# Patient Record
Sex: Male | Born: 1980 | Race: Black or African American | Hispanic: No | Marital: Single | Smoking: Current every day smoker
Health system: Southern US, Community
[De-identification: ages and names within clinical notes are randomized; demographics above are authoritative.]

## PROBLEM LIST (undated history)

## (undated) HISTORY — PX: FINGER SURGERY: SHX640

---

## 2003-11-11 ENCOUNTER — Emergency Department (HOSPITAL_COMMUNITY): Admission: EM | Admit: 2003-11-11 | Discharge: 2003-11-11 | Payer: Self-pay | Admitting: Podiatry

## 2004-04-30 ENCOUNTER — Observation Stay (HOSPITAL_COMMUNITY): Admission: EM | Admit: 2004-04-30 | Discharge: 2004-05-01 | Payer: Self-pay | Admitting: Emergency Medicine

## 2004-06-15 ENCOUNTER — Ambulatory Visit (HOSPITAL_BASED_OUTPATIENT_CLINIC_OR_DEPARTMENT_OTHER): Admission: RE | Admit: 2004-06-15 | Discharge: 2004-06-15 | Payer: Self-pay | Admitting: Orthopedic Surgery

## 2004-11-02 ENCOUNTER — Ambulatory Visit (HOSPITAL_BASED_OUTPATIENT_CLINIC_OR_DEPARTMENT_OTHER): Admission: RE | Admit: 2004-11-02 | Discharge: 2004-11-02 | Payer: Self-pay | Admitting: Orthopedic Surgery

## 2004-12-07 ENCOUNTER — Emergency Department (HOSPITAL_COMMUNITY): Admission: EM | Admit: 2004-12-07 | Discharge: 2004-12-07 | Payer: Self-pay | Admitting: Emergency Medicine

## 2012-10-31 ENCOUNTER — Encounter (HOSPITAL_COMMUNITY): Payer: Self-pay

## 2012-10-31 ENCOUNTER — Emergency Department (HOSPITAL_COMMUNITY)
Admission: EM | Admit: 2012-10-31 | Discharge: 2012-10-31 | Disposition: A | Discharge status: Home / Self Care | Payer: Managed Care, Other (non HMO) | Attending: Emergency Medicine | Admitting: Emergency Medicine

## 2012-10-31 DIAGNOSIS — F17200 Nicotine dependence, unspecified, uncomplicated: Secondary | ICD-10-CM | POA: Insufficient documentation

## 2012-10-31 DIAGNOSIS — B349 Viral infection, unspecified: Secondary | ICD-10-CM

## 2012-10-31 DIAGNOSIS — B9789 Other viral agents as the cause of diseases classified elsewhere: Secondary | ICD-10-CM | POA: Insufficient documentation

## 2012-10-31 DIAGNOSIS — R5381 Other malaise: Secondary | ICD-10-CM | POA: Insufficient documentation

## 2012-10-31 LAB — CBC WITH DIFFERENTIAL/PLATELET
HCT: 46.7 % (ref 39.0–52.0)
Hemoglobin: 16.7 g/dL (ref 13.0–17.0)
Lymphocytes Relative: 20 % (ref 12–46)
Lymphs Abs: 0.8 10*3/uL (ref 0.7–4.0)
MCH: 29.4 pg (ref 26.0–34.0)
MCV: 82.2 fL (ref 78.0–100.0)
Monocytes Absolute: 0.8 10*3/uL (ref 0.1–1.0)
Monocytes Relative: 21 % — ABNORMAL HIGH (ref 3–12)
Neutrophils Relative %: 58 % (ref 43–77)
Platelets: 176 10*3/uL (ref 150–400)
RBC: 5.68 MIL/uL (ref 4.22–5.81)
WBC: 3.8 10*3/uL — ABNORMAL LOW (ref 4.0–10.5)

## 2012-10-31 LAB — COMPREHENSIVE METABOLIC PANEL
ALT: 27 U/L (ref 0–53)
Albumin: 4.2 g/dL (ref 3.5–5.2)
BUN: 15 mg/dL (ref 6–23)
Calcium: 9.7 mg/dL (ref 8.4–10.5)
Sodium: 134 mEq/L — ABNORMAL LOW (ref 135–145)
Total Protein: 8 g/dL (ref 6.0–8.3)

## 2012-10-31 MED ORDER — SODIUM CHLORIDE 0.9 % IV BOLUS (SEPSIS)
1000.0000 mL | Freq: Once | INTRAVENOUS | Status: AC
  Administered 2012-10-31: 1000 mL via INTRAVENOUS

## 2012-10-31 MED ORDER — ONDANSETRON HCL 4 MG/2ML IJ SOLN
4.0000 mg | Freq: Once | INTRAMUSCULAR | Status: AC
  Administered 2012-10-31: 4 mg via INTRAVENOUS
  Filled 2012-10-31: qty 2

## 2012-10-31 MED ORDER — ACETAMINOPHEN 500 MG PO TABS: 500.0000 mg | ORAL_TABLET | Freq: Four times a day (QID) | ORAL | Status: DC | PRN

## 2012-10-31 MED ORDER — ACETAMINOPHEN 500 MG PO TABS
1000.0000 mg | ORAL_TABLET | Freq: Once | ORAL | Status: AC
Start: 2012-10-31 — End: 2012-10-31
  Administered 2012-10-31: 1000 mg via ORAL
  Filled 2012-10-31: qty 2

## 2012-10-31 MED ORDER — PROMETHAZINE HCL 25 MG PO TABS: 25.0000 mg | ORAL_TABLET | Freq: Four times a day (QID) | ORAL | Status: DC | PRN

## 2012-10-31 NOTE — ED Provider Notes (Signed)
History     CSN: [id number]  Arrival date & time 10/31/12  4098   First MD Initiated Contact with Patient 10/31/12 [phone]      Chief Complaint  Patient presents with  . Emesis    (Consider location/radiation/quality/duration/timing/severity/associated sxs/prior treatment) HPI Comments: Patient is a 32 year old male who presents with a 2 day history of nausea and vomiting. Symptoms started gradually and progressively worsened since the onset. Yesterday patient reports 4 episodes of vomiting. He did not try anything for symptoms. Today he still feels nauseous and fatigued but has not vomited. Patient reports associated moderate headache. No aggravating/alleviating factors.    History reviewed. No pertinent past medical history.  Past Surgical History  Procedure Date  . Finger surgery     2nd and 3rd digits    No family history on file.  History  Substance Use Topics  . Smoking status: Current Every Day Smoker    Types: Cigars  . Smokeless tobacco: Not on file  . Alcohol Use: No      Review of Systems  Constitutional: Positive for fatigue.  Gastrointestinal: Positive for nausea and vomiting.  All other systems reviewed and are negative.    Allergies  Review of patient's allergies indicates no known allergies.  Home Medications   Current Outpatient Rx  Name  Route  Sig  Dispense  Refill  . ACETAMINOPHEN 500 MG PO TABS   Oral   Take 1,000 mg by mouth every 6 (six) hours as needed. For pain           BP 130/86  Pulse 63  Temp 98.4 F (36.9 C) (Oral)  Resp 20  SpO2 98%  Physical Exam  Nursing note and vitals reviewed. Constitutional: He is oriented to person, place, and time. He appears well-developed and well-nourished. No distress.  HENT:  Head: Normocephalic and atraumatic.  Mouth/Throat: Oropharynx is clear and moist. No oropharyngeal exudate.  Eyes: Conjunctivae normal and EOM are normal. Pupils are equal, round, and reactive to light.  Neck:  Normal range of motion. Neck supple.  Cardiovascular: Normal rate and regular rhythm.  Exam reveals no gallop and no friction rub.   No murmur heard. Pulmonary/Chest: Effort normal and breath sounds normal. He has no wheezes. He has no rales. He exhibits no tenderness.  Abdominal: Soft. He exhibits no distension. There is no tenderness. There is no rebound and no guarding.  Musculoskeletal: Normal range of motion.  Neurological: He is alert and oriented to person, place, and time. Coordination normal.       Speech is goal-oriented. Moves limbs without ataxia.   Skin: Skin is warm and dry. He is not diaphoretic.  Psychiatric: He has a normal mood and affect. His behavior is normal.    ED Course  Procedures (including critical care time)  Labs Reviewed  CBC WITH DIFFERENTIAL - Abnormal; Notable for the following:    WBC 3.8 (*)     Monocytes Relative 21 (*)     All other components within normal limits  COMPREHENSIVE METABOLIC PANEL - Abnormal; Notable for the following:    Sodium 134 (*)     Glucose, Bld 101 (*)     AST 75 (*)     All other components within normal limits  URINALYSIS, MICROSCOPIC ONLY   No results found.   1. Viral illness       MDM  8:45 AM Basic labs, urinalysis pending. Patient will have fluids and zofran. Vitals stable.   10:01 AM  Labs unremarkable. Patient feels better after fluids and zofran. I will give him tylenol for headache. Patient will be discharged with tylenol and zofran. Patient non toxic appearing and afebrile with stable vitals. Patient instructed to return with worsening or concerning symptoms. No further evaluation needed at this time.       Emilia Beck, PA-C 10/31/12 1009

## 2012-10-31 NOTE — ED Notes (Signed)
Pt presents for vomiting since Sunday. No diarrhea and no emesis today but feels fatigued.

## 2012-11-01 NOTE — ED Provider Notes (Signed)
Medical screening examination/treatment/procedure(s) were performed by non-physician practitioner and as supervising physician I was immediately available for consultation/collaboration.  Luchiano Viscomi K Mesiah Manzo, MD 11/01/12 0243 

## 2014-03-06 ENCOUNTER — Encounter (HOSPITAL_COMMUNITY): Payer: Self-pay | Admitting: Emergency Medicine

## 2014-03-06 ENCOUNTER — Emergency Department (HOSPITAL_COMMUNITY)
Admission: EM | Admit: 2014-03-06 | Discharge: 2014-03-07 | Disposition: A | Discharge status: Home / Self Care | Payer: BC Managed Care – PPO | Attending: Emergency Medicine | Admitting: Emergency Medicine

## 2014-03-06 DIAGNOSIS — S0190XA Unspecified open wound of unspecified part of head, initial encounter: Secondary | ICD-10-CM | POA: Insufficient documentation

## 2014-03-06 DIAGNOSIS — W268XXA Contact with other sharp object(s), not elsewhere classified, initial encounter: Secondary | ICD-10-CM | POA: Insufficient documentation

## 2014-03-06 DIAGNOSIS — S0191XA Laceration without foreign body of unspecified part of head, initial encounter: Secondary | ICD-10-CM

## 2014-03-06 DIAGNOSIS — Y9389 Activity, other specified: Secondary | ICD-10-CM | POA: Insufficient documentation

## 2014-03-06 DIAGNOSIS — F17200 Nicotine dependence, unspecified, uncomplicated: Secondary | ICD-10-CM | POA: Insufficient documentation

## 2014-03-06 DIAGNOSIS — Y929 Unspecified place or not applicable: Secondary | ICD-10-CM | POA: Insufficient documentation

## 2014-03-06 DIAGNOSIS — S61409A Unspecified open wound of unspecified hand, initial encounter: Secondary | ICD-10-CM | POA: Insufficient documentation

## 2014-03-06 DIAGNOSIS — S61419A Laceration without foreign body of unspecified hand, initial encounter: Secondary | ICD-10-CM

## 2014-03-06 MED ORDER — OXYCODONE-ACETAMINOPHEN 5-325 MG PO TABS
1.0000 | ORAL_TABLET | Freq: Once | ORAL | Status: AC
  Administered 2014-03-06: 1 via ORAL
  Filled 2014-03-06: qty 1
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
## 2014-03-07 NOTE — ED Provider Notes (Signed)
Medical screening examination/treatment/procedure(s) were performed by non-physician practitioner and as supervising physician I was immediately available for consultation/collaboration.   EKG Interpretation None       Glynn Octave, MD 03/07/14 1059

## 2014-03-07 NOTE — Discharge Instructions (Signed)
Keep sutures clean and dry. Follow up with urgent care in 1 week for suture removal. Return to the ED for new concerns.

## 2014-03-24 ENCOUNTER — Ambulatory Visit (INDEPENDENT_AMBULATORY_CARE_PROVIDER_SITE_OTHER): Payer: BC Managed Care – PPO | Admitting: Family Medicine

## 2014-03-24 VITALS — BP 122/78 | HR 70 | Temp 97.9°F | Resp 15 | Ht 71.0 in | Wt 149.0 lb

## 2014-03-24 DIAGNOSIS — IMO0002 Reserved for concepts with insufficient information to code with codable children: Secondary | ICD-10-CM

## 2014-03-24 DIAGNOSIS — Z4802 Encounter for removal of sutures: Secondary | ICD-10-CM

## 2014-06-05 NOTE — Progress Notes (Signed)
   Subjective:    Patient ID: Kevin Dominguez, male    DOB: 10/31/1980, 33 y.o.   MRN: [id number] Chief Complaint  Patient presents with  . Suture / Staple Removal    patient was to have them removed at Urgent care on 68 but was unable to make it    HPI  History reviewed. No pertinent past medical history. Current Outpatient Prescriptions on File Prior to Visit  Medication Sig Dispense Refill  . acetaminophen (TYLENOL) 500 MG tablet Take 1,000 mg by mouth every 6 (six) hours as needed. For pain       No current facility-administered medications on file prior to visit.   No Known Allergies   Review of Systems  Constitutional: Negative for fever, chills, activity change and appetite change.  Cardiovascular: Negative for leg swelling.  Gastrointestinal: Negative for nausea, vomiting, abdominal pain, diarrhea and constipation.  Musculoskeletal: Negative for gait problem, joint swelling and myalgias.  Skin: Positive for wound. Negative for rash.  Neurological: Negative for weakness and numbness.  Hematological: Negative for adenopathy. Does not bruise/bleed easily.       Objective:  BP 122/78  Pulse 70  Temp(Src) 97.9 F (36.6 C) (Oral)  Resp 15  Ht 5\' 11"  (1.803 m)  Wt 149 lb (67.586 kg)  BMI 20.79 kg/m2  SpO2 96%  Physical Exam  Constitutional: He is oriented to person, place, and time. He appears well-developed and well-nourished. No distress.  HENT:  Head: Normocephalic and atraumatic.  Eyes: No scleral icterus.  Pulmonary/Chest: Effort normal.  Neurological: He is alert and oriented to person, place, and time.  Skin: Skin is warm and dry. He is not diaphoretic.  Psychiatric: He has a normal mood and affect. His behavior is normal.          Assessment & Plan:  Visit for suture removal  Laceration    Norberto SorensonEva Shaw, MD MPH

## 2017-08-28 ENCOUNTER — Emergency Department (HOSPITAL_COMMUNITY)
Admission: EM | Admit: 2017-08-28 | Discharge: 2017-08-28 | Disposition: A | Discharge status: Home / Self Care | Payer: BLUE CROSS/BLUE SHIELD | Attending: Emergency Medicine | Admitting: Emergency Medicine

## 2017-08-28 ENCOUNTER — Other Ambulatory Visit: Payer: Self-pay

## 2017-08-28 ENCOUNTER — Emergency Department (HOSPITAL_COMMUNITY): Payer: BLUE CROSS/BLUE SHIELD

## 2017-08-28 ENCOUNTER — Encounter (HOSPITAL_COMMUNITY): Payer: Self-pay

## 2017-08-28 DIAGNOSIS — S6991XA Unspecified injury of right wrist, hand and finger(s), initial encounter: Secondary | ICD-10-CM | POA: Diagnosis present

## 2017-08-28 DIAGNOSIS — F17290 Nicotine dependence, other tobacco product, uncomplicated: Secondary | ICD-10-CM | POA: Diagnosis not present

## 2017-08-28 DIAGNOSIS — W2209XA Striking against other stationary object, initial encounter: Secondary | ICD-10-CM | POA: Diagnosis not present

## 2017-08-28 DIAGNOSIS — S62356A Nondisplaced fracture of shaft of fifth metacarpal bone, right hand, initial encounter for closed fracture: Secondary | ICD-10-CM | POA: Insufficient documentation

## 2017-08-28 DIAGNOSIS — Y998 Other external cause status: Secondary | ICD-10-CM | POA: Diagnosis not present

## 2017-08-28 DIAGNOSIS — Y939 Activity, unspecified: Secondary | ICD-10-CM | POA: Diagnosis not present

## 2017-08-28 DIAGNOSIS — Y929 Unspecified place or not applicable: Secondary | ICD-10-CM | POA: Insufficient documentation

## 2017-08-28 MED ORDER — NAPROXEN 500 MG PO TABS: 500.0000 mg | ORAL_TABLET | Freq: Two times a day (BID) | ORAL | 0 refills | Status: DC

## 2017-08-28 NOTE — Discharge Instructions (Signed)
Elevate arm to help reduce swelling.  Naproxen - take twice daily. Take with food. Please follow up with Dr. Mina MarbleWeingold (Hand doctor)

## 2017-08-28 NOTE — ED Triage Notes (Signed)
Per Pt, pt is coming from home with complaints of right hand swelling and pain secondary to hitting a wall yesterday.
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
## 2017-08-28 NOTE — Progress Notes (Signed)
Orthopedic Tech Progress Note Patient Details:  Kevin Dominguez 09/26/1981 [id number]  Ortho Devices Type of Ortho Device: Ulna gutter splint Ortho Device/Splint Location: Right  Ortho Device/Splint Interventions: Application   Alvina ChouWilliams, Malvina Schadler C 08/28/2017, 7:04 PM

## 2018-01-06 ENCOUNTER — Emergency Department (HOSPITAL_COMMUNITY)
Admission: EM | Admit: 2018-01-06 | Discharge: 2018-01-06 | Disposition: A | Discharge status: Home / Self Care | Payer: BLUE CROSS/BLUE SHIELD | Attending: Emergency Medicine | Admitting: Emergency Medicine

## 2018-01-06 ENCOUNTER — Encounter (HOSPITAL_COMMUNITY): Payer: Self-pay

## 2018-01-06 DIAGNOSIS — F17290 Nicotine dependence, other tobacco product, uncomplicated: Secondary | ICD-10-CM | POA: Diagnosis not present

## 2018-01-06 DIAGNOSIS — Z79899 Other long term (current) drug therapy: Secondary | ICD-10-CM | POA: Insufficient documentation

## 2018-01-06 DIAGNOSIS — K0889 Other specified disorders of teeth and supporting structures: Secondary | ICD-10-CM | POA: Diagnosis present

## 2018-01-06 MED ORDER — PENICILLIN V POTASSIUM 500 MG PO TABS
500.0000 mg | ORAL_TABLET | Freq: Once | ORAL | Status: AC
  Administered 2018-01-06: 500 mg via ORAL
  Filled 2018-01-06: qty 1

## 2018-01-06 MED ORDER — IBUPROFEN 800 MG PO TABS: 800.0000 mg | ORAL_TABLET | Freq: Three times a day (TID) | ORAL | 0 refills | Status: DC

## 2018-01-06 MED ORDER — PENICILLIN V POTASSIUM 500 MG PO TABS: 500.0000 mg | ORAL_TABLET | Freq: Four times a day (QID) | ORAL | 0 refills | Status: DC

## 2018-01-06 MED ORDER — IBUPROFEN 800 MG PO TABS
800.0000 mg | ORAL_TABLET | Freq: Once | ORAL | Status: AC
Start: 2018-01-06 — End: 2018-01-06
  Administered 2018-01-06: 800 mg via ORAL
  Filled 2018-01-06: qty 1

## 2018-01-06 NOTE — ED Notes (Signed)
Bed: WTR6 Expected date:  Expected time:  Means of arrival:  Comments: 

## 2018-01-06 NOTE — ED Triage Notes (Signed)
Pt complains of upper dental pain since last night

## 2018-01-06 NOTE — ED Provider Notes (Signed)
[facility]-EMERGENCY DEPT Provider Note   CSN: [id number] Arrival date & time: 01/06/18  0441     History   Chief Complaint Chief Complaint  Patient presents with  . Dental Pain    HPI Kevin Dominguez is a 37 y.o. male.  Patient presents to the emergency department with chief complaint of dental pain.  He states pain started yesterday.  He is uncertain whether he broke a tooth or not.  He states that he has tried taking over-the-counter pain medicine with no relief.  He reports subjective fever.  He does not have a dentist.  The pain does not radiate.  He rates pain is moderate.  The history is provided by the patient. No language interpreter was used.    History reviewed. No pertinent past medical history.  There are no active problems to display for this patient.   Past Surgical History:  Procedure Laterality Date  . FINGER SURGERY     2nd and 3rd digits        Home Medications    Prior to Admission medications   Medication Sig Start Date End Date Taking? Authorizing Provider  acetaminophen (TYLENOL) 500 MG tablet Take 1,000 mg by mouth every 6 (six) hours as needed. For pain    [provider]  naproxen (NAPROSYN) 500 MG tablet Take 1 tablet (500 mg total) 2 (two) times daily by mouth. 08/28/17   Bethel Born, PA-C    Family History History reviewed. No pertinent family history.  Social History Social History   Tobacco Use  . Smoking status: Current Every Day Smoker    Types: Cigars  . Smokeless tobacco: Never Used  Substance Use Topics  . Alcohol use: Yes  . Drug use: Yes    Types: Marijuana     Allergies   Patient has no known allergies.   Review of Systems Review of Systems  All other systems reviewed and are negative.    Physical Exam Updated Vital Signs BP (!) 149/111 (BP Location: Left Arm)   Pulse (!) 59   Temp 99.6 F (37.6 C) (Oral)   Resp 16   SpO2 99%   Physical Exam Physical  Exam  Constitutional: Pt appears well-developed and well-nourished.  HENT:  Head: Normocephalic.  Right Ear: Tympanic membrane, external ear and ear canal normal.  Left Ear: Tympanic membrane, external ear and ear canal normal.  Nose: Nose normal. Right sinus exhibits no maxillary sinus tenderness and no frontal sinus tenderness. Left sinus exhibits no maxillary sinus tenderness and no frontal sinus tenderness.  Mouth/Throat: Uvula is midline, oropharynx is clear and moist and mucous membranes are normal. No oral lesions. No uvula swelling or lacerations. No oropharyngeal exudate, posterior oropharyngeal edema, posterior oropharyngeal erythema or tonsillar abscesses.  Poor dentition No gingival swelling, fluctuance or induration No gross abscess  No sublingual edema, tenderness to palpation, or sign of Ludwig's angina, or deep space infection Pain at right upper premolar Eyes: Conjunctivae are normal. Pupils are equal, round, and reactive to light. Right eye exhibits no discharge. Left eye exhibits no discharge.  Neck: Normal range of motion. Neck supple.  No stridor Handling secretions without difficulty No nuchal rigidity No cervical lymphadenopathy Cardiovascular: Normal rate, regular rhythm and normal heart sounds.   Pulmonary/Chest: Effort normal. No respiratory distress.  Equal chest rise  Abdominal: Soft. Bowel sounds are normal. Pt exhibits no distension. There is no tenderness.  Lymphadenopathy: Pt has no cervical adenopathy.  Neurological: Pt is alert and  oriented x 4  Skin: Skin is warm and dry.  Psychiatric: Pt has a normal mood and affect.  Nursing note and vitals reviewed.    ED Treatments / Results  Labs (all labs ordered are listed, but only abnormal results are displayed) Labs Reviewed - No data to display  EKG None  Radiology No results found.  Procedures Procedures (including critical care time)  Medications Ordered in ED Medications  penicillin v  potassium (VEETID) tablet 500 mg (has no administration in time range)  ibuprofen (ADVIL,MOTRIN) tablet 800 mg (has no administration in time range)     Initial Impression / Assessment and Plan / ED Course  I have reviewed the triage vital signs and the nursing notes.  Pertinent labs & imaging results that were available during my care of the patient were reviewed by me and considered in my medical decision making (see chart for details).     Patient with dentalgia.  No abscess requiring immediate incision and drainage.  Exam not concerning for Ludwig's angina or pharyngeal abscess.  Will treat with penicillin. Pt instructed to follow-up with dentist.  Discussed return precautions. Pt safe for discharge.  Final Clinical Impressions(s) / ED Diagnoses   Final diagnoses:  Pain, dental    ED Discharge Orders        Ordered    penicillin v potassium (VEETID) 500 MG tablet  4 times daily     01/06/18 0557    ibuprofen (ADVIL,MOTRIN) 800 MG tablet  3 times daily     01/06/18 0557       Roxy HorsemanBrowning, Shantoria Ellwood, PA-C 01/06/18 0557    Palumbo, April, MD 01/06/18 [phone]

## 2018-03-26 ENCOUNTER — Other Ambulatory Visit: Payer: Self-pay

## 2018-03-26 ENCOUNTER — Emergency Department (HOSPITAL_COMMUNITY)
Admission: EM | Admit: 2018-03-26 | Discharge: 2018-03-26 | Disposition: A | Discharge status: Home / Self Care | Payer: BLUE CROSS/BLUE SHIELD | Attending: Emergency Medicine | Admitting: Emergency Medicine

## 2018-03-26 ENCOUNTER — Encounter (HOSPITAL_COMMUNITY): Payer: Self-pay | Admitting: Emergency Medicine

## 2018-03-26 DIAGNOSIS — M25521 Pain in right elbow: Secondary | ICD-10-CM | POA: Diagnosis present

## 2018-03-26 DIAGNOSIS — Z79899 Other long term (current) drug therapy: Secondary | ICD-10-CM | POA: Insufficient documentation

## 2018-03-26 DIAGNOSIS — F17290 Nicotine dependence, other tobacco product, uncomplicated: Secondary | ICD-10-CM | POA: Diagnosis not present

## 2018-03-26 DIAGNOSIS — M7701 Medial epicondylitis, right elbow: Secondary | ICD-10-CM | POA: Insufficient documentation

## 2018-03-26 MED ORDER — PREDNISONE 10 MG PO TABS: ORAL_TABLET | ORAL | 0 refills | Status: DC

## 2018-03-26 NOTE — ED Triage Notes (Signed)
Pt c/o right elbow pain, was taking Motrin 800mg  for pain, now motrin not helping. No known injury.

## 2018-03-26 NOTE — ED Provider Notes (Signed)
[facility] Provider [facility]   CSN: [id number] Arrival date & time: 03/26/18  1144     History   Chief Complaint Chief Complaint  Patient presents with  . Elbow Pain    HPI Kevin Dominguez is a 37 y.o. male.  Pt comes in with c/o right elbow pain that has been going on for about a month. He states that he switched jobs and he does a lot of repetitive motion. Denies numbness or weakness. Has taken ibuprofen with short term relief     History reviewed. No pertinent past medical history.  There are no active problems to display for this patient.   Past Surgical History:  Procedure Laterality Date  . FINGER SURGERY     2nd and 3rd digits        Home Medications    Prior to Admission medications   Medication Sig Start Date End Date Taking? Authorizing Provider  acetaminophen (TYLENOL) 500 MG tablet Take 1,000 mg by mouth every 6 (six) hours as needed. For pain    [provider]  ibuprofen (ADVIL,MOTRIN) 800 MG tablet Take 1 tablet (800 mg total) by mouth 3 (three) times daily. 01/06/18   Roxy HorsemanBrowning, Robert, PA-C  naproxen (NAPROSYN) 500 MG tablet Take 1 tablet (500 mg total) 2 (two) times daily by mouth. 08/28/17   Bethel BornGekas, Kelly Marie, PA-C  penicillin v potassium (VEETID) 500 MG tablet Take 1 tablet (500 mg total) by mouth 4 (four) times daily. 01/06/18   Roxy HorsemanBrowning, Robert, PA-C  predniSONE (DELTASONE) 10 MG tablet 6 day step down dose 03/26/18   Teressa LowerPickering, Sanjit Mcmichael, NP    Family History No family history on file.  Social History Social History   Tobacco Use  . Smoking status: Current Every Day Smoker    Types: Cigars  . Smokeless tobacco: Never Used  Substance Use Topics  . Alcohol use: Yes  . Drug use: Yes    Types: Marijuana     Allergies   Patient has no known allergies.   Review of Systems Review of Systems  All other systems reviewed and are negative.    Physical Exam Updated Vital Signs BP  (!) 148/108 (BP Location: Left Arm)   Pulse (!) 52   Temp 98.2 F (36.8 C) (Oral)   Resp 18   Ht 6' (1.829 m)   Wt 68 kg (150 lb)   SpO2 100%   BMI 20.34 kg/m   Physical Exam  Constitutional: He is oriented to person, place, and time. He appears well-developed and well-nourished.  Cardiovascular: Normal rate.  Pulmonary/Chest: Effort normal.  Musculoskeletal: Normal range of motion.  Full rom of the right elbow. No swelling or deformity noted. Tender to the medial elbow  Neurological: He is alert and oriented to person, place, and time.  Skin: Skin is warm and dry.  Nursing note and vitals reviewed.    ED Treatments / Results  Labs (all labs ordered are listed, but only abnormal results are displayed) Labs Reviewed - No data to display  EKG None  Radiology No results found.  Procedures Procedures (including critical care time)  Medications Ordered in ED Medications - No data to display   Initial Impression / Assessment and Plan / ED Course  I have reviewed the triage vital signs and the nursing notes.  Pertinent labs & imaging results that were available during my care of the patient were reviewed by me and considered in my medical decision making (see chart for  details).     Discussed supportive care and follow up with pt  Final Clinical Impressions(s) / ED Diagnoses   Final diagnoses:  Medial epicondylitis of right elbow    ED Discharge Orders        Ordered    predniSONE (DELTASONE) 10 MG tablet     03/26/18 1343       Teressa Lower, NP 03/26/18 1346    Donnetta Hutching, MD 03/28/18 1040

## 2018-03-26 NOTE — ED Notes (Signed)
Pt verbalized understanding discharge instructions and denies any further needs or questions at this time. VS stable, ambulatory and steady gait.   See EDP assessment / note.   

## 2018-03-26 NOTE — Discharge Instructions (Signed)
Get a compression band from the pharmacy

## 2018-06-08 IMAGING — DX DG HAND COMPLETE 3+V*R*
3 series · 3 of 3 positions shown · non-contrast
Comparison: None.

CLINICAL DATA: Patient reports he punched a wall, patient had
trouble straightening his right hand, patient.

EXAM:
RIGHT HAND - COMPLETE 3+ VIEW

[hand ap]
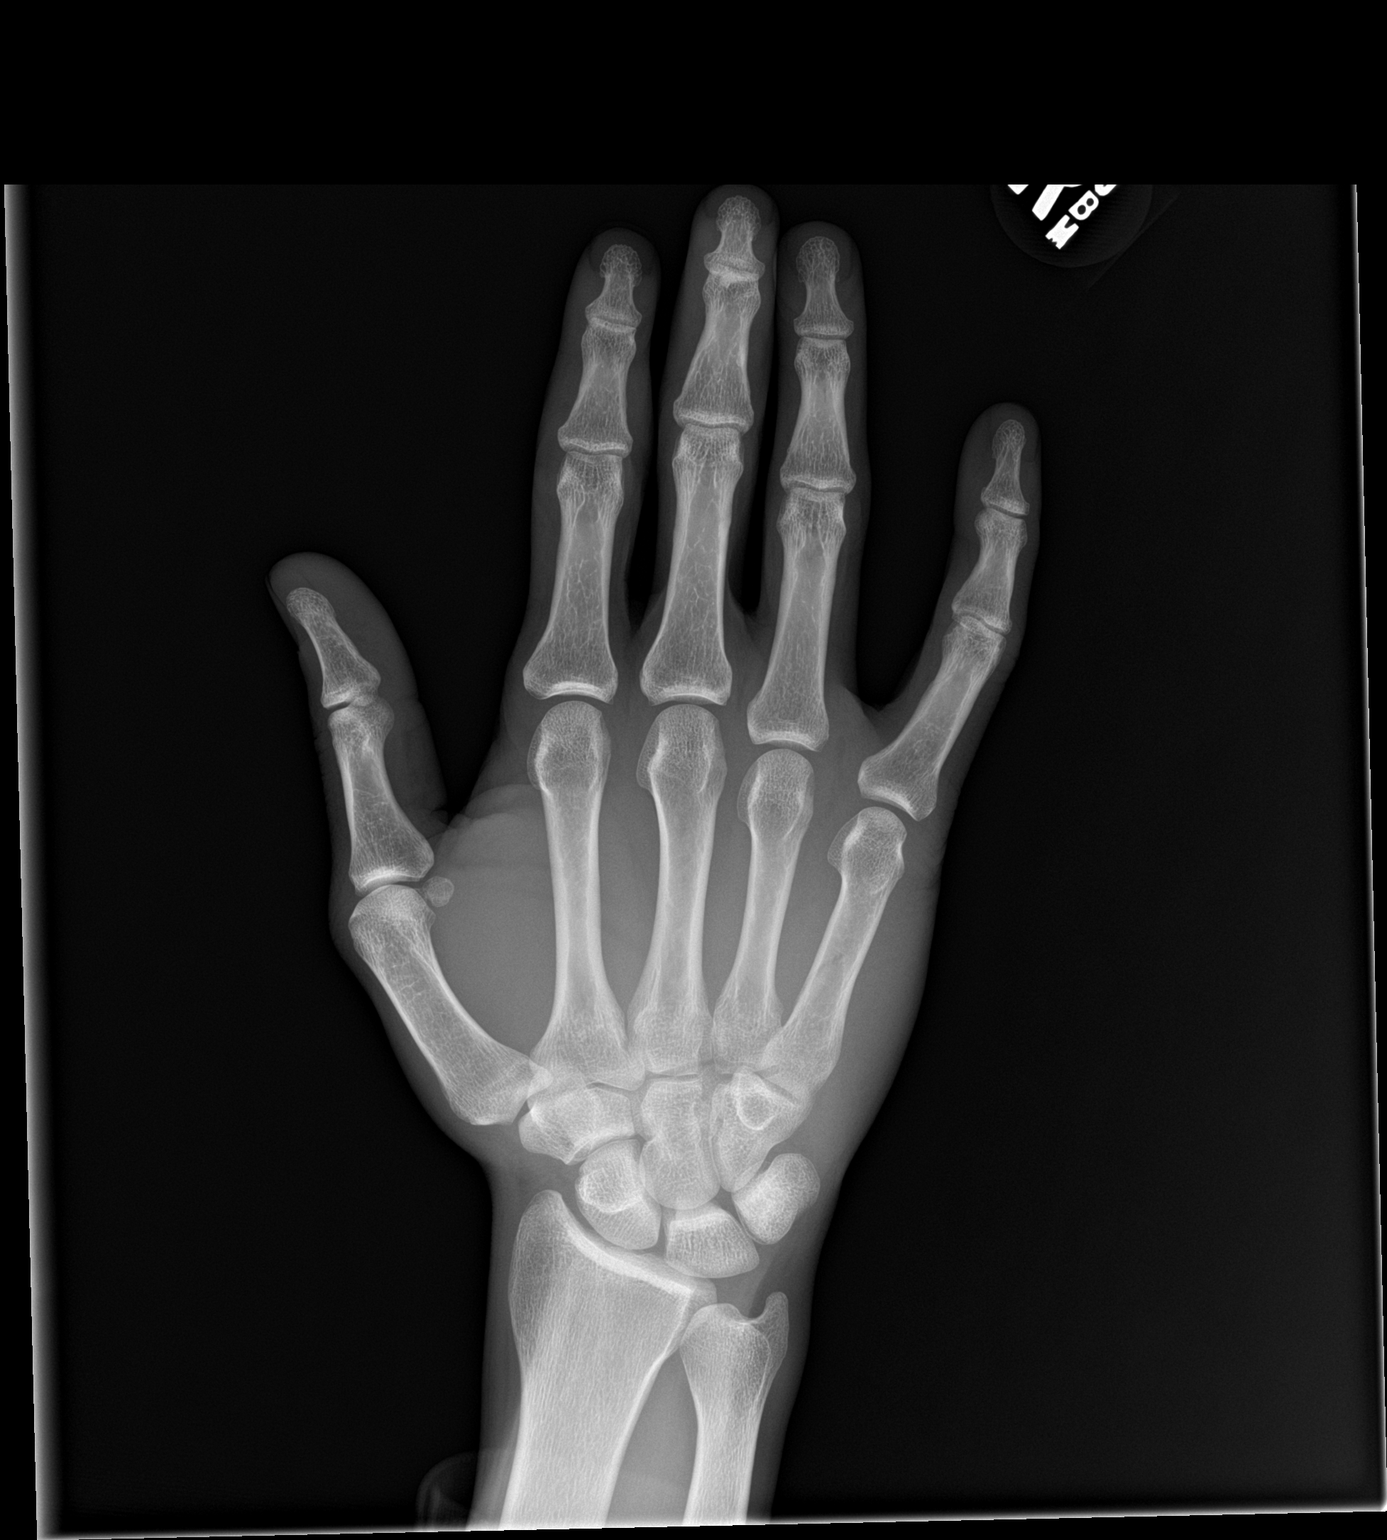

[hand obl]
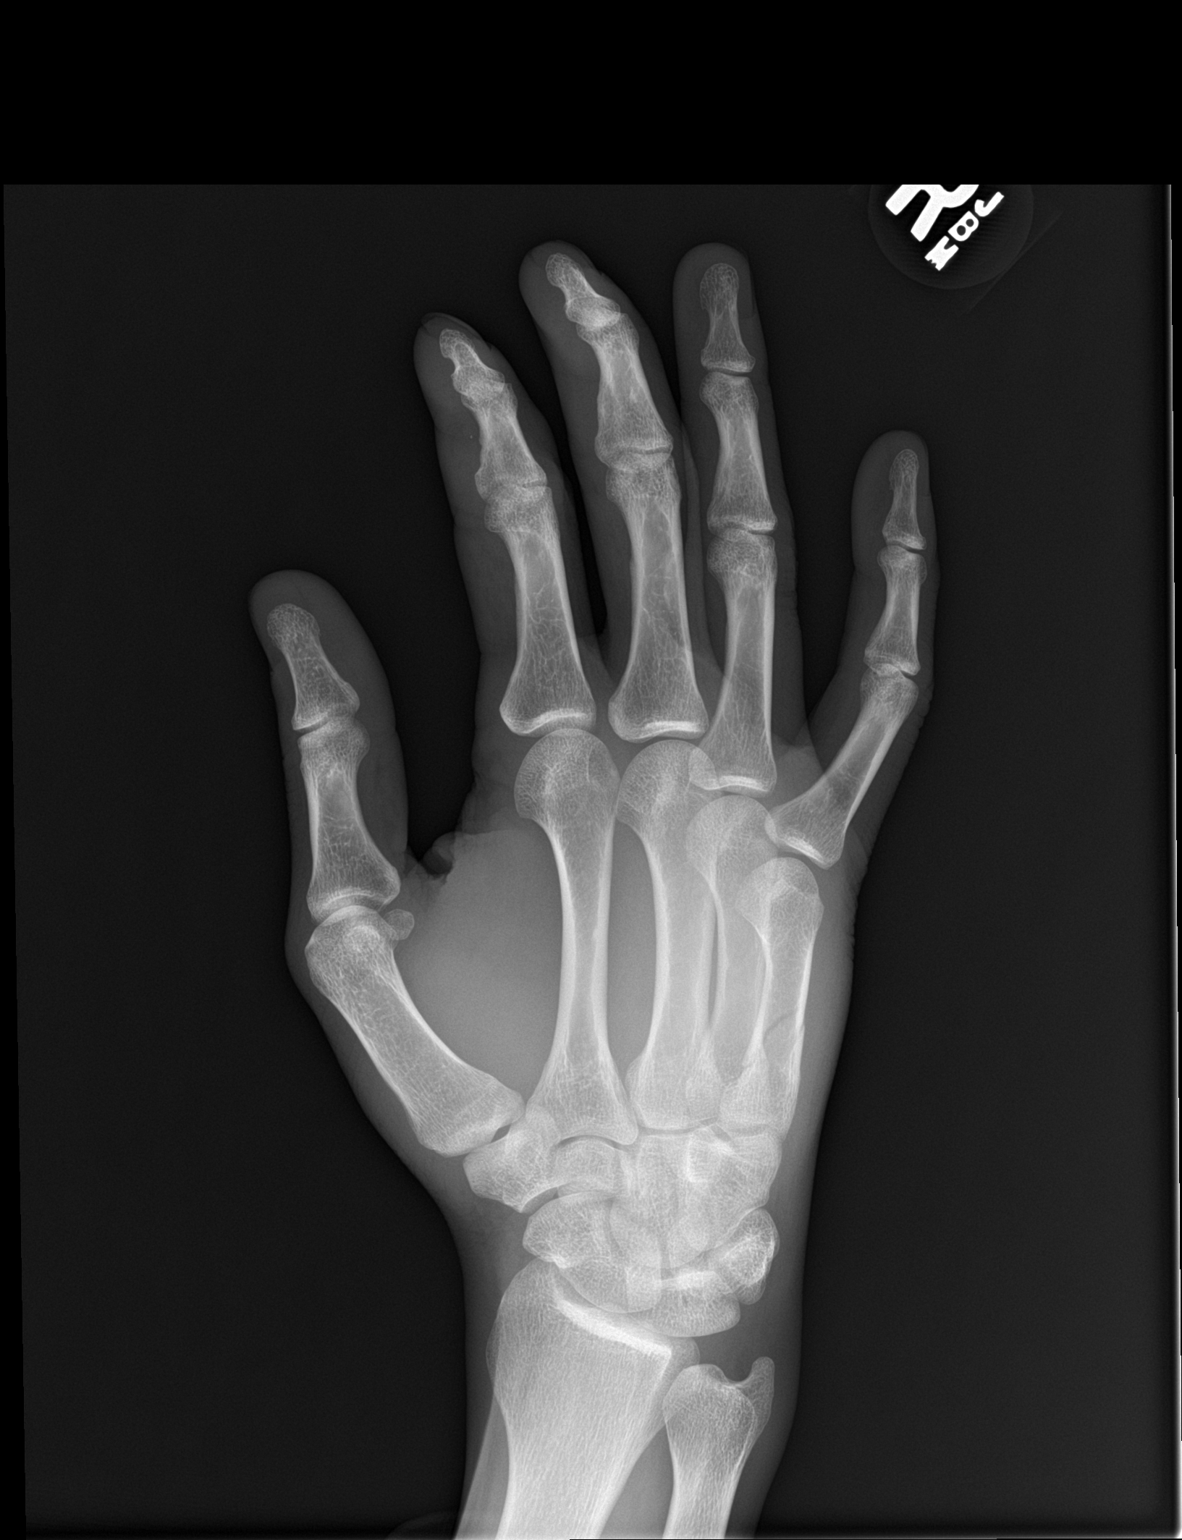

[hand lat]
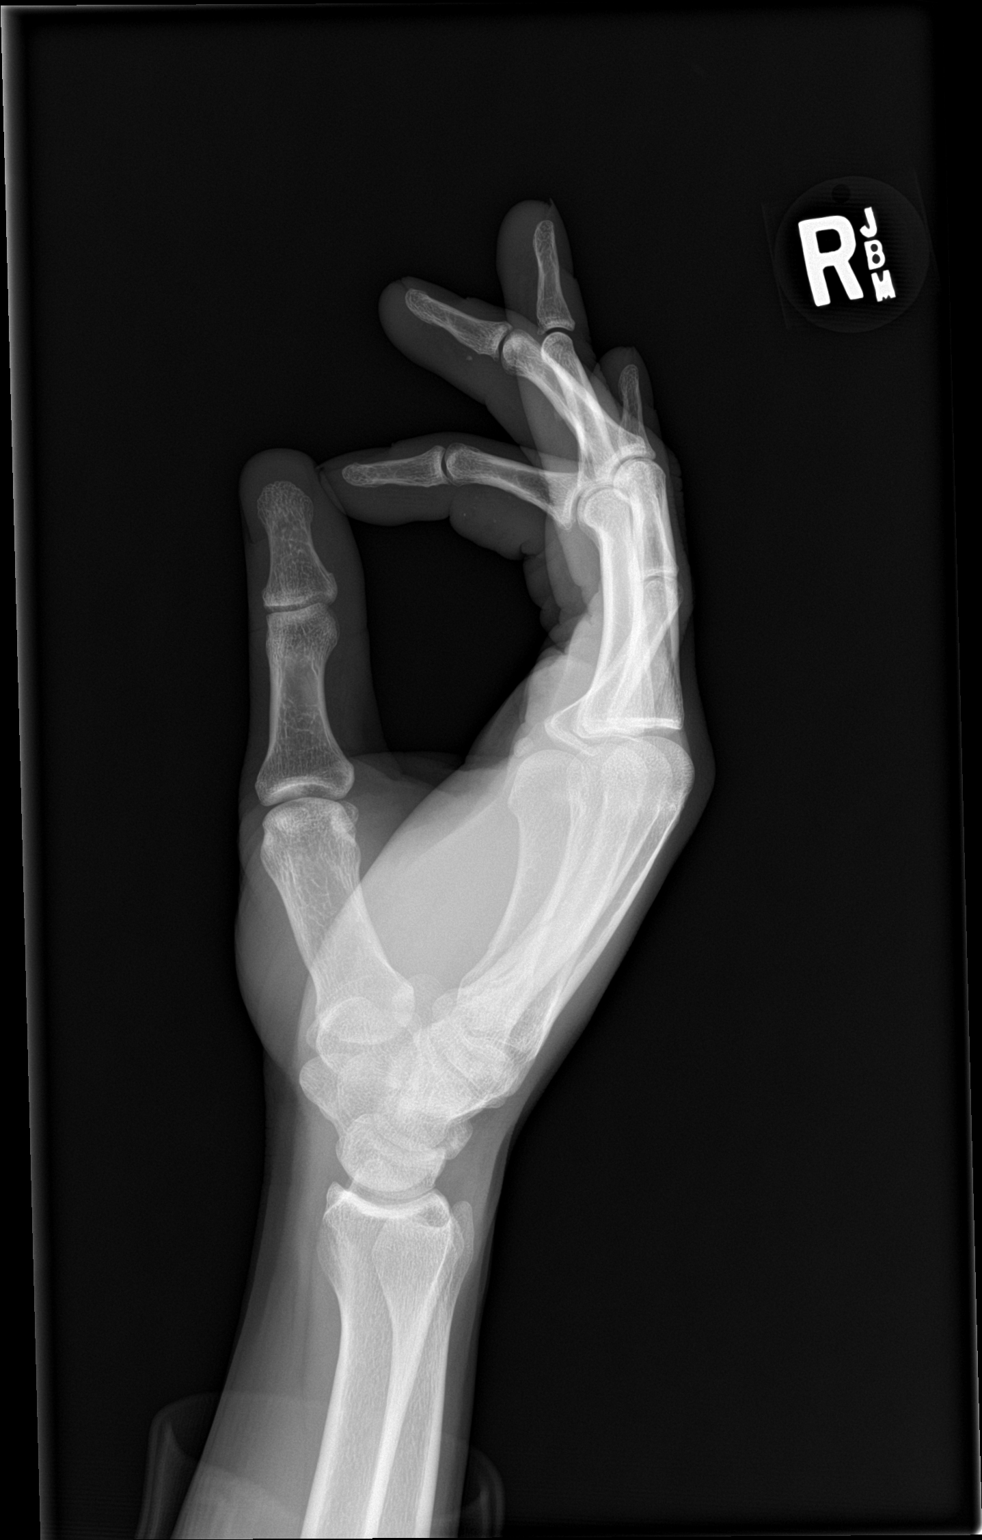

[3 of 3 positions shown; findings below may reference images not displayed]

FINDINGS: There is an oblique, nondisplaced, non comminuted and nonangulated
fracture of the fifth metacarpal extending from the proximal to the
midshaft.

No other fractures.  The joints are normally spaced and aligned.

Mild ulnar sided soft tissue swelling.
IMPRESSION: Nondisplaced, nonangulated fracture of the fifth metacarpal.

## 2018-07-19 ENCOUNTER — Emergency Department (HOSPITAL_COMMUNITY)
Admission: EM | Admit: 2018-07-19 | Discharge: 2018-07-19 | Disposition: A | Discharge status: Home / Self Care | Payer: BLUE CROSS/BLUE SHIELD | Attending: Emergency Medicine | Admitting: Emergency Medicine

## 2018-07-19 ENCOUNTER — Other Ambulatory Visit: Payer: Self-pay

## 2018-07-19 ENCOUNTER — Encounter (HOSPITAL_COMMUNITY): Payer: Self-pay

## 2018-07-19 ENCOUNTER — Emergency Department (HOSPITAL_COMMUNITY): Payer: BLUE CROSS/BLUE SHIELD

## 2018-07-19 DIAGNOSIS — J189 Pneumonia, unspecified organism: Secondary | ICD-10-CM

## 2018-07-19 DIAGNOSIS — J181 Lobar pneumonia, unspecified organism: Secondary | ICD-10-CM | POA: Insufficient documentation

## 2018-07-19 DIAGNOSIS — R05 Cough: Secondary | ICD-10-CM | POA: Diagnosis present

## 2018-07-19 DIAGNOSIS — R112 Nausea with vomiting, unspecified: Secondary | ICD-10-CM | POA: Diagnosis not present

## 2018-07-19 DIAGNOSIS — F17210 Nicotine dependence, cigarettes, uncomplicated: Secondary | ICD-10-CM | POA: Insufficient documentation

## 2018-07-19 DIAGNOSIS — R197 Diarrhea, unspecified: Secondary | ICD-10-CM | POA: Insufficient documentation

## 2018-07-19 LAB — COMPREHENSIVE METABOLIC PANEL
ALK PHOS: 59 U/L (ref 38–126)
ALT: 17 U/L (ref 0–44)
ANION GAP: 11 (ref 5–15)
AST: 21 U/L (ref 15–41)
Albumin: 3.9 g/dL (ref 3.5–5.0)
BUN: 10 mg/dL (ref 6–20)
CALCIUM: 9.6 mg/dL (ref 8.9–10.3)
CHLORIDE: 99 mmol/L (ref 98–111)
CO2: 26 mmol/L (ref 22–32)
Creatinine, Ser: 1.04 mg/dL (ref 0.61–1.24)
GFR calc Af Amer: >60 mL/min (ref >60)
GFR calc non Af Amer: >60 mL/min (ref >60)
Glucose, Bld: 99 mg/dL (ref 70–99)
POTASSIUM: 3.5 mmol/L (ref 3.5–5.1)
SODIUM: 136 mmol/L (ref 135–145)
Total Bilirubin: 1 mg/dL (ref 0.3–1.2)
Total Protein: 8.2 g/dL — ABNORMAL HIGH (ref 6.5–8.1)

## 2018-07-19 LAB — CBC
HCT: 43.6 % (ref 39.0–52.0)
HEMOGLOBIN: 14.9 g/dL (ref 13.0–17.0)
MCH: 29.6 pg (ref 26.0–34.0)
MCHC: 34.2 g/dL (ref 30.0–36.0)
MCV: 86.5 fL (ref 78.0–100.0)
Platelets: 253 10*3/uL (ref 150–400)
RBC: 5.04 MIL/uL (ref 4.22–5.81)
RDW: 13.1 % (ref 11.5–15.5)
WBC: 8.1 10*3/uL (ref 4.0–10.5)

## 2018-07-19 LAB — LIPASE, BLOOD: LIPASE: 20 U/L (ref 11–51)

## 2018-07-19 MED ORDER — SODIUM CHLORIDE 0.9 % IV BOLUS
1000.0000 mL | Freq: Once | INTRAVENOUS | Status: AC
  Administered 2018-07-19: 1000 mL via INTRAVENOUS

## 2018-07-19 MED ORDER — ONDANSETRON 4 MG PO TBDP
4.0000 mg | ORAL_TABLET | Freq: Once | ORAL | Status: AC
  Administered 2018-07-19: 4 mg via ORAL
  Filled 2018-07-19: qty 1

## 2018-07-19 MED ORDER — AZITHROMYCIN 250 MG PO TABS: 250.0000 mg | ORAL_TABLET | Freq: Every day | ORAL | 0 refills | Status: DC

## 2018-07-19 MED ORDER — SODIUM CHLORIDE 0.9 % IV SOLN
1.0000 g | Freq: Once | INTRAVENOUS | Status: AC
  Administered 2018-07-19: 1 g via INTRAVENOUS
  Filled 2018-07-19: qty 10

## 2018-07-19 NOTE — ED Notes (Signed)
Patient verbalizes understanding of discharge instructions. Opportunity for questioning and answers were provided. Armband removed by staff, pt discharged from ED.  

## 2018-07-19 NOTE — ED Triage Notes (Signed)
Pt presents for evaluation of abd pain and nausea/vomiting x 2 days. Reports one of his children was sick this weekend but not with vomiting. Pt reports he is too weak to work and cant keep any fluids down.

## 2018-07-19 NOTE — ED Provider Notes (Signed)
[facility] EMERGENCY DEPARTMENT Provider Note   CSN: [id number] Arrival date & time: 07/19/18  1207     History   Chief Complaint Chief Complaint  Patient presents with  . Abdominal Pain    HPI Kevin Dominguez is a 37 y.o. male.  37 year old male presents with complaint of cough x1 week, states cough is productive with yellow mucus and now reports vomiting after coughing.  Patient also reports diarrhea described as loose stools.  Denies blood in his emesis or stools.  Reports mild abdominal cramping.  States he has had chills, has not been monitoring his temperature.  Patient is exposed to his child who had recent vomiting and diarrhea.  Patient denies history of asthma, chronic lung disease, vaping.  Denies decrease in urine or urinary symptoms.  No other complaints or concerns.     History reviewed. No pertinent past medical history.  There are no active problems to display for this patient.   Past Surgical History:  Procedure Laterality Date  . FINGER SURGERY     2nd and 3rd digits        Home Medications    Prior to Admission medications   Medication Sig Start Date End Date Taking? Authorizing Provider  acetaminophen (TYLENOL) 500 MG tablet Take 1,000 mg by mouth every 6 (six) hours as needed. For pain    [provider]  azithromycin (ZITHROMAX) 250 MG tablet Take 1 tablet (250 mg total) by mouth daily. Take first 2 tablets together, then 1 every day until finished. 07/19/18   Jeannie Fend, PA-C  ibuprofen (ADVIL,MOTRIN) 800 MG tablet Take 1 tablet (800 mg total) by mouth 3 (three) times daily. 01/06/18   Roxy Horseman, PA-C  naproxen (NAPROSYN) 500 MG tablet Take 1 tablet (500 mg total) 2 (two) times daily by mouth. 08/28/17   Bethel Born, PA-C  penicillin v potassium (VEETID) 500 MG tablet Take 1 tablet (500 mg total) by mouth 4 (four) times daily. 01/06/18   Roxy Horseman, PA-C  predniSONE (DELTASONE) 10 MG  tablet 6 day step down dose 03/26/18   Teressa Lower, NP    Family History No family history on file.  Social History Social History   Tobacco Use  . Smoking status: Current Every Day Smoker    Types: Cigars  . Smokeless tobacco: Never Used  Substance Use Topics  . Alcohol use: Yes  . Drug use: Yes    Types: Marijuana     Allergies   Patient has no known allergies.   Review of Systems Review of Systems  Constitutional: Positive for chills. Negative for diaphoresis.  HENT: Negative for congestion, ear pain, rhinorrhea, sinus pressure, sinus pain and sore throat.   Respiratory: Positive for cough. Negative for chest tightness, shortness of breath and wheezing.   Cardiovascular: Negative for chest pain.  Gastrointestinal: Positive for abdominal pain, diarrhea, nausea and vomiting. Negative for abdominal distention, blood in stool and constipation.  Genitourinary: Negative for decreased urine volume, dysuria and frequency.  Musculoskeletal: Negative for arthralgias and myalgias.  Skin: Negative for rash and wound.  Allergic/Immunologic: Negative for immunocompromised state.  Neurological: Negative for dizziness, weakness and headaches.  Hematological: Negative for adenopathy. Does not bruise/bleed easily.  Psychiatric/Behavioral: Negative for confusion.  All other systems reviewed and are negative.    Physical Exam Updated Vital Signs BP (!) 125/98   Pulse 91   Temp 99.5 F (37.5 C) (Oral)   Resp 16   SpO2 100%   Physical  Exam  Constitutional: He is oriented to person, place, and time. He appears well-developed and well-nourished. No distress.  HENT:  Head: Normocephalic and atraumatic.  Cardiovascular: Normal rate, regular rhythm and intact distal pulses.  Pulmonary/Chest: Effort normal. He has no wheezes. He has rhonchi in the left lower field.  Abdominal: Normal appearance and bowel sounds are normal. There is no tenderness. There is no CVA tenderness.    Neurological: He is alert and oriented to person, place, and time.  Skin: Skin is warm and dry. He is not diaphoretic.  Psychiatric: He has a normal mood and affect. His behavior is normal.  Nursing note and vitals reviewed.    ED Treatments / Results  Labs (all labs ordered are listed, but only abnormal results are displayed) Labs Reviewed  COMPREHENSIVE METABOLIC PANEL - Abnormal; Notable for the following components:      Result Value   Total Protein 8.2 (*)    All other components within normal limits  LIPASE, BLOOD  CBC    EKG None  Radiology Dg Chest 2 View  Result Date: 07/19/2018 CLINICAL DATA:  Acute chest pain and fever for several days. EXAM: CHEST - 2 VIEW COMPARISON:  None. FINDINGS: LEFT LOWER lobe airspace disease is compatible with pneumonia. The cardiomediastinal silhouette is unremarkable. There is no evidence of pulmonary edema, suspicious pulmonary nodule/mass, pleural effusion, or pneumothorax. No acute bony abnormalities are identified. IMPRESSION: LEFT LOWER lobe airspace disease compatible with pneumonia. Electronically Signed   By: Harmon Pier M.D.   On: 07/19/2018 13:16    Procedures Procedures (including critical care time)  Medications Ordered in ED Medications  sodium chloride 0.9 % bolus 1,000 mL (1,000 mLs Intravenous New Bag/Given 07/19/18 1342)  cefTRIAXone (ROCEPHIN) 1 g in sodium chloride 0.9 % 100 mL IVPB (1 g Intravenous New Bag/Given 07/19/18 1359)  ondansetron (ZOFRAN-ODT) disintegrating tablet 4 mg (4 mg Oral Given 07/19/18 1303)     Initial Impression / Assessment and Plan / ED Course  I have reviewed the triage vital signs and the nursing notes.  Pertinent labs & imaging results that were available during my care of the patient were reviewed by me and considered in my medical decision making (see chart for details).  Clinical Course as of Jul 19 1424  Thu Jul 19, 2018  [id number] year old male presents with complaint of cough x1  week, now with vomiting and diarrhea.  Patient is exposed to his child who has had similar vomiting and diarrhea symptoms.  Patient reports occasional abdominal cramping, denies abdominal pain.  On exam his abdomen is soft and nontender, he has coarse lung sounds left lower lung field.  Chest x-ray shows left lower lung pneumonia.  Patient was given Rocephin while in the emergency room, discharged home with prescription for Zithromax.  Referral given for PCP, recommend repeat chest x-ray in 6 weeks.  Review of lab work shows unremarkable CMP, lipase, CBC.   [LM]    Clinical Course User Index [LM] Jeannie Fend, PA-C   Final Clinical Impressions(s) / ED Diagnoses   Final diagnoses:  Community acquired pneumonia of left lower lobe of lung (HCC)  Nausea vomiting and diarrhea    ED Discharge Orders         Ordered    azithromycin (ZITHROMAX) 250 MG tablet  Daily     07/19/18 1423           Alden Hipp 07/19/18 1425    Azalia Bilis, MD 07/19/18 2356

## 2018-07-19 NOTE — ED Notes (Signed)
Patient transported to X-ray 

## 2018-07-19 NOTE — Discharge Instructions (Signed)
Recheck with your family doctor, return to ER for worsening or concerning symptoms.  Patient had a repeat chest x-ray in 6 weeks with a primary care provider, referral given. Take Zithromax as prescribed and complete the full course. Return to ER for worsening or concerning symptoms.

## 2019-04-29 IMAGING — DX DG CHEST 2V
2 series · 2 of 2 positions shown · non-contrast
Comparison: None.

CLINICAL DATA: Acute chest pain and fever for several days.

EXAM:
CHEST - 2 VIEW

[w chest pa]
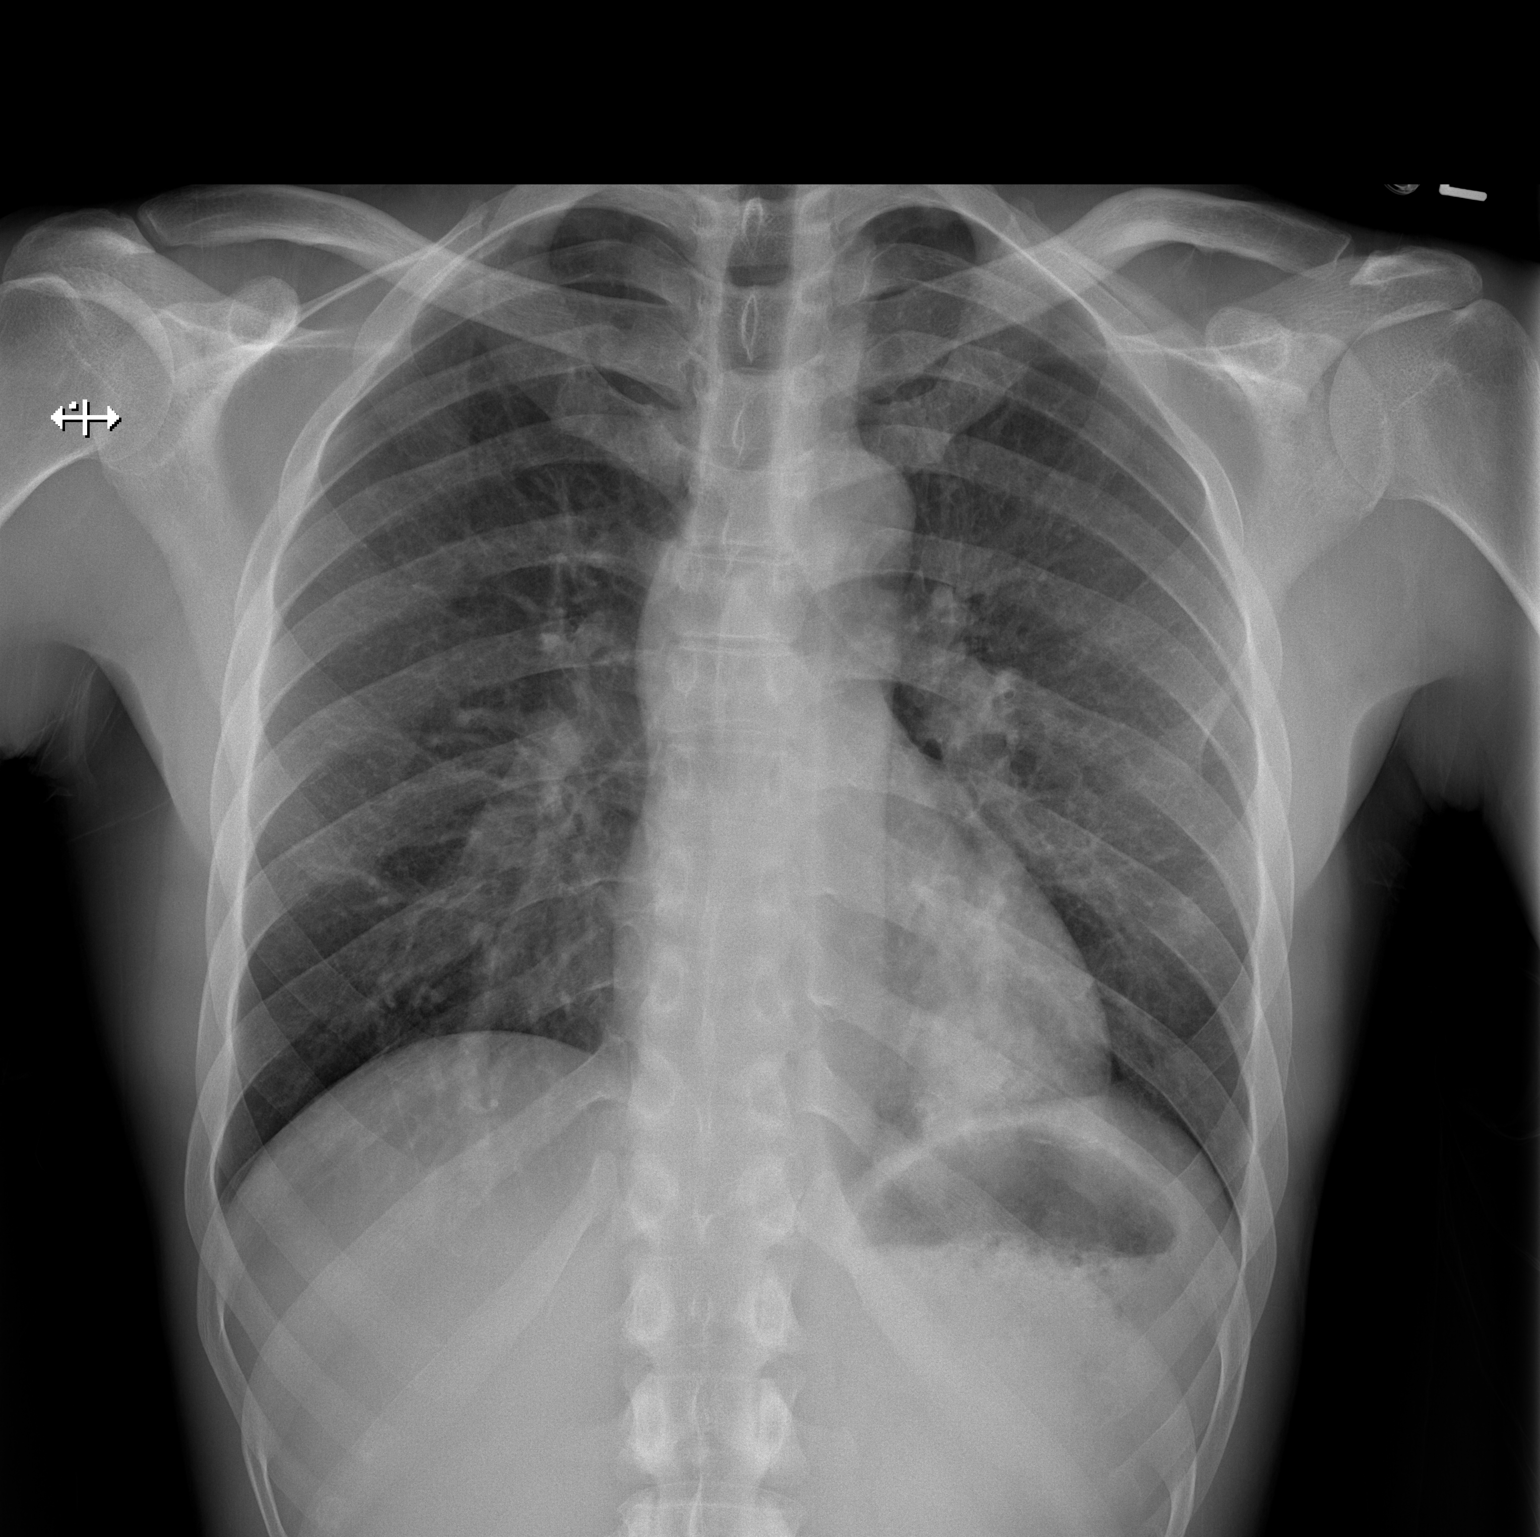

[w chest lat]
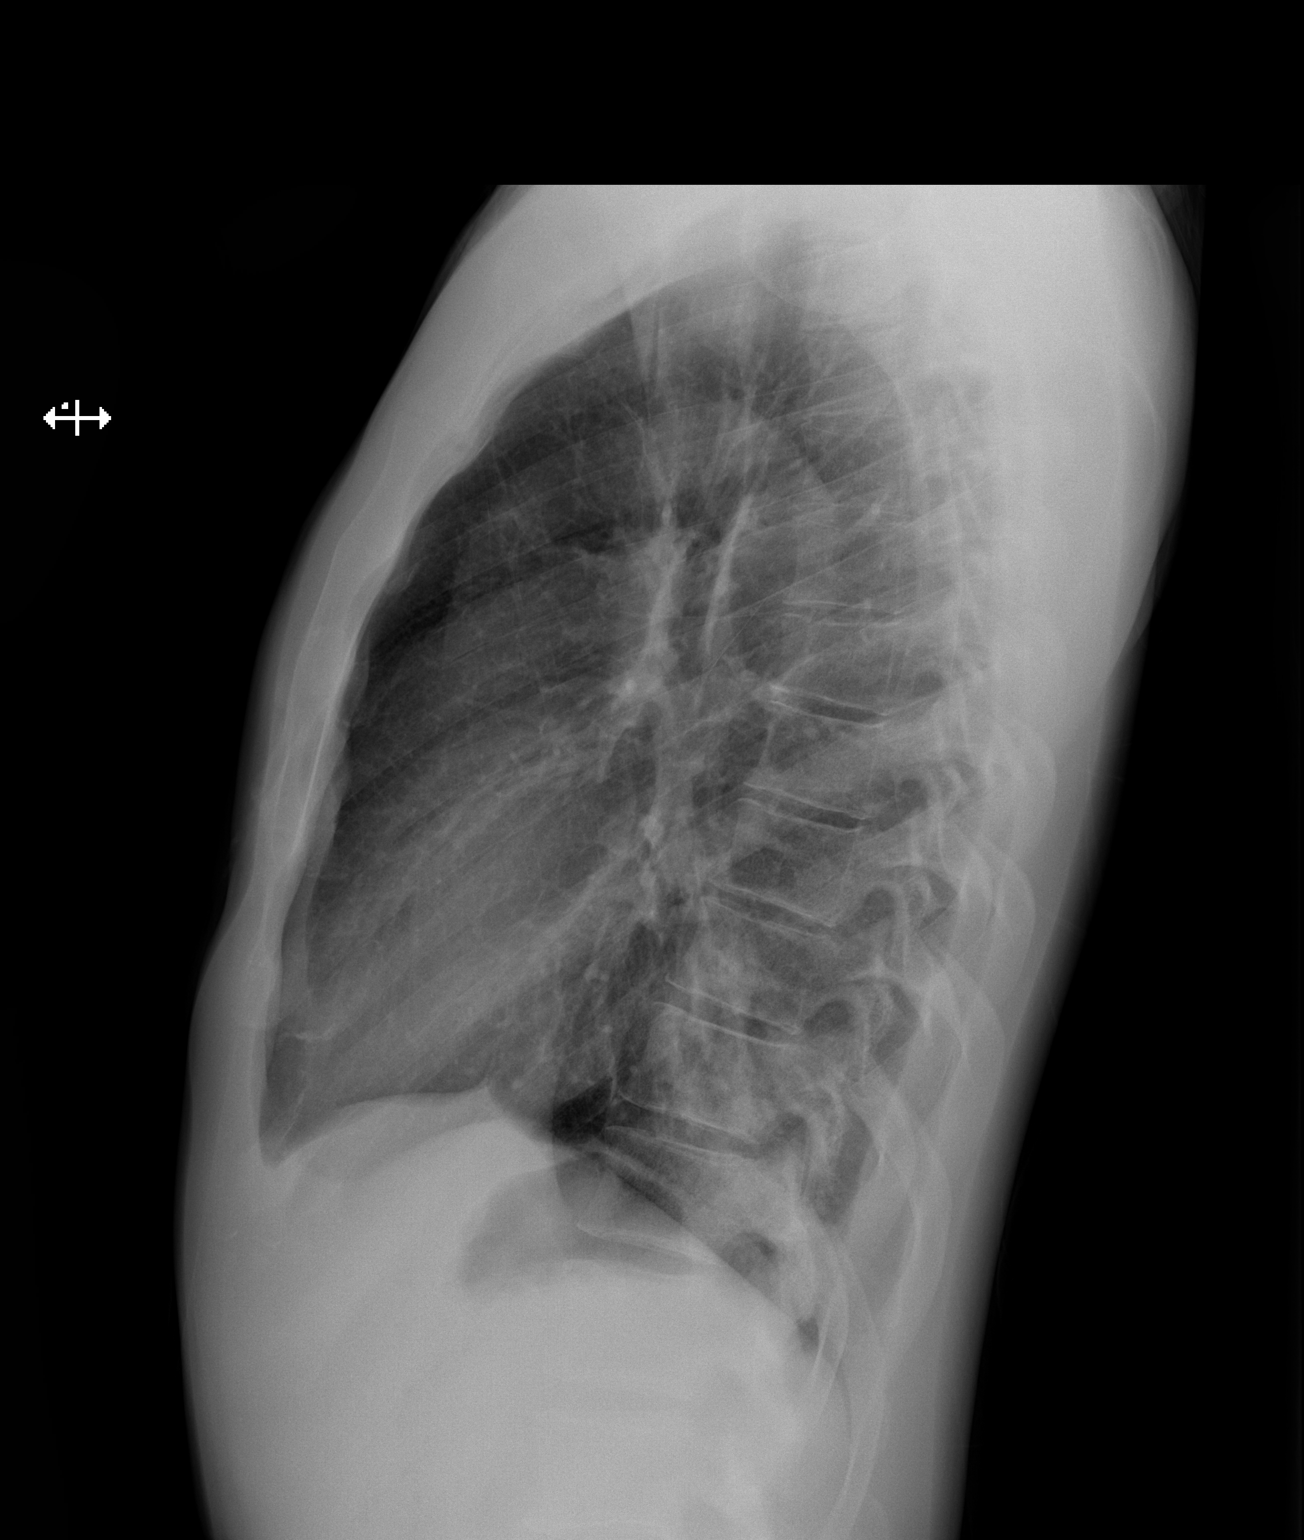

[2 of 2 positions shown; findings below may reference images not displayed]

FINDINGS: LEFT LOWER lobe airspace disease is compatible with pneumonia.

The cardiomediastinal silhouette is unremarkable.

There is no evidence of pulmonary edema, suspicious pulmonary
nodule/mass, pleural effusion, or pneumothorax.

No acute bony abnormalities are identified.
IMPRESSION: LEFT LOWER lobe airspace disease compatible with pneumonia.

## 2020-02-09 ENCOUNTER — Encounter (HOSPITAL_COMMUNITY): Payer: Self-pay | Admitting: Emergency Medicine

## 2020-02-09 ENCOUNTER — Emergency Department (HOSPITAL_COMMUNITY)
Admission: EM | Admit: 2020-02-09 | Discharge: 2020-02-09 | Disposition: A | Discharge status: Home / Self Care | Payer: BLUE CROSS/BLUE SHIELD | Attending: Emergency Medicine | Admitting: Emergency Medicine

## 2020-02-09 ENCOUNTER — Emergency Department (HOSPITAL_COMMUNITY): Payer: BLUE CROSS/BLUE SHIELD

## 2020-02-09 ENCOUNTER — Other Ambulatory Visit: Payer: Self-pay

## 2020-02-09 DIAGNOSIS — M79641 Pain in right hand: Secondary | ICD-10-CM

## 2020-02-09 DIAGNOSIS — W2209XA Striking against other stationary object, initial encounter: Secondary | ICD-10-CM | POA: Insufficient documentation

## 2020-02-09 DIAGNOSIS — Y999 Unspecified external cause status: Secondary | ICD-10-CM | POA: Insufficient documentation

## 2020-02-09 DIAGNOSIS — S62339A Displaced fracture of neck of unspecified metacarpal bone, initial encounter for closed fracture: Secondary | ICD-10-CM | POA: Insufficient documentation

## 2020-02-09 DIAGNOSIS — Y929 Unspecified place or not applicable: Secondary | ICD-10-CM | POA: Insufficient documentation

## 2020-02-09 DIAGNOSIS — Y939 Activity, unspecified: Secondary | ICD-10-CM | POA: Insufficient documentation

## 2020-02-09 MED ORDER — OXYCODONE-ACETAMINOPHEN 5-325 MG PO TABS: 1.0000 | ORAL_TABLET | ORAL | 0 refills | Status: DC | PRN

## 2020-02-09 MED ORDER — HYDROCODONE-ACETAMINOPHEN 5-325 MG PO TABS
2.0000 | ORAL_TABLET | Freq: Once | ORAL | Status: AC
  Administered 2020-02-09: 2 via ORAL
  Filled 2020-02-09: qty 2

## 2020-02-09 NOTE — ED Provider Notes (Signed)
[facility] EMERGENCY DEPARTMENT Provider Note   CSN: [id number] Arrival date & time: 02/09/20  1147     History Chief Complaint  Patient presents with  . Hand Pain    Kevin Dominguez is a 39 y.o. male.  HPI  Patient is a 39 year old gentleman with no significant past medical history presented today with complaints of right hand pain.  He states he had a door/punched a door last night.  He denies any abrasion or laceration.  Denies any pain in his wrist, thumb, any other injuries to his body.  States the pain is severe, achy, worsening since last night, associated with some swelling.  He has taken nothing for pain prior to arrival.     History reviewed. No pertinent past medical history.  There are no problems to display for this patient.   Past Surgical History:  Procedure Laterality Date  . FINGER SURGERY     2nd and 3rd digits       No family history on file.  Social History   Tobacco Use  . Smoking status: Current Every Day Smoker    Types: Cigars  . Smokeless tobacco: Never Used  Substance Use Topics  . Alcohol use: Yes  . Drug use: Yes    Types: Marijuana    Home Medications Prior to Admission medications   Medication Sig Start Date End Date Taking? Authorizing Provider  acetaminophen (TYLENOL) 500 MG tablet Take 1,000 mg by mouth every 6 (six) hours as needed. For pain    [provider]  azithromycin (ZITHROMAX) 250 MG tablet Take 1 tablet (250 mg total) by mouth daily. Take first 2 tablets together, then 1 every day until finished. 07/19/18   Tacy Learn, PA-C  ibuprofen (ADVIL,MOTRIN) 800 MG tablet Take 1 tablet (800 mg total) by mouth 3 (three) times daily. 01/06/18   Montine Circle, PA-C  naproxen (NAPROSYN) 500 MG tablet Take 1 tablet (500 mg total) 2 (two) times daily by mouth. 08/28/17   Recardo Evangelist, PA-C  penicillin v potassium (VEETID) 500 MG tablet Take 1 tablet (500 mg total) by mouth 4 (four)  times daily. 01/06/18   Montine Circle, PA-C  predniSONE (DELTASONE) 10 MG tablet 6 day step down dose 03/26/18   Glendell Docker, NP    Allergies    Patient has no known allergies.  Review of Systems   Review of Systems  Constitutional: Negative for chills and fever.  HENT: Negative for congestion.   Respiratory: Negative for shortness of breath.   Cardiovascular: Negative for chest pain.  Gastrointestinal: Negative for abdominal pain.  Musculoskeletal:       Right hand pain    Physical Exam Updated Vital Signs BP (!) 142/104 (BP Location: Left Arm)   Pulse 85   Temp 98.5 F (36.9 C) (Oral)   Resp 16   SpO2 98%   Physical Exam Vitals and nursing note reviewed.  Constitutional:      General: He is not in acute distress.    Appearance: Normal appearance. He is not ill-appearing.  HENT:     Head: Normocephalic and atraumatic.  Eyes:     General: No scleral icterus.       Right eye: No discharge.        Left eye: No discharge.     Conjunctiva/sclera: Conjunctivae normal.  Cardiovascular:     Rate and Rhythm: Normal rate.     Comments: Bilateral radial pulses within normal limits Pulmonary:  Effort: Pulmonary effort is normal.     Breath sounds: No stridor.  Musculoskeletal:     Comments: Just to palpation of the right metacarpal of the fifth digit.  Significant swelling present no obvious deformity but difficult to assess with swelling.  Skin:    General: Skin is warm and dry.     Capillary Refill: Capillary refill takes less than 2 seconds.  Neurological:     Mental Status: He is alert and oriented to person, place, and time. Mental status is at baseline.     Comments: Station intact in fingertips of both hands.  Psychiatric:        Mood and Affect: Mood normal.        Behavior: Behavior normal.     ED Results / Procedures / Treatments   Labs (all labs ordered are listed, but only abnormal results are displayed) Labs Reviewed - No data to  display  EKG None  Radiology DG Hand Complete Right  Result Date: 02/09/2020 CLINICAL DATA:  Hit on door, pain EXAM: RIGHT HAND - COMPLETE 3+ VIEW COMPARISON:  None. FINDINGS: Nondisplaced oblique fracture of the right fifth metacarpal diaphysis. No other fracture or dislocation. Joint spaces are preserved. Soft tissue edema about the hypothenar hand. IMPRESSION: Nondisplaced oblique fracture of the right fifth metacarpal diaphysis. Electronically Signed   By: Lauralyn Primes M.D.   On: 02/09/2020 12:47    Procedures Procedures (including critical care time) SPLINT APPLICATION Date/Time: 2:34 PM Authorized by: Gailen Shelter Consent: Verbal consent obtained. Risks and benefits: risks, benefits and alternatives were discussed Consent given by: patient Splint applied by: orthopedic technician Location details: right hand Splint type: ulnar gutter Supplies used: hard cast - moldlable  Post-procedure: The splinted body part was neurovascularly unchanged following the procedure. Patient tolerance: Patient tolerated the procedure well with no immediate complications.    Medications Ordered in ED Medications  HYDROcodone-acetaminophen (NORCO/VICODIN) 5-325 MG per tablet 2 tablet (2 tablets Oral Given 02/09/20 1414)    ED Course  I have reviewed the triage vital signs and the nursing notes.  Pertinent labs & imaging results that were available during my care of the patient were reviewed by me and considered in my medical decision making (see chart for details).    MDM Rules/Calculators/A&P                      Patient 39 year old male with no pertinent past medical history presented today with right hand pain after punching a wall last night.  Patient has physical exam notable for tenderness palpation of the right mid phalanx of the fifth metacarpal.  He has intermittent swelling and tenderness to palpation.  No significant concern for boxer's fracture given mechanism and history and  physical exam.  Will obtain plain x-ray of right hand.  Patient is x-ray to bilirubin itself.  There is oblique/spiral midshaft right fifth metacarpal fracture that is not significantly displaced.  He is distally neurovascularly intact.  He has good sensation and good blood flow.  I discussed the case with Dr. Clarene Duke my attending physician.  She agrees my plan to place patient in an ulnar gutter splint and have patient call tomorrow to make an appointment with Dr. Aundria Rud of [facility].  Patient reassessed after splint placement.  He continues to be distally neurovascularly intact.  I will provide patient with short course of Percocet.  Will recommend Tylenol be proven primarily for pain.  Rest ice and elevate and maintain splint.  He will call tomorrow morning to make an appointment.  He was given return precautions.   Final Clinical Impression(s) / ED Diagnoses Final diagnoses:  Closed boxer's fracture, initial encounter    Rx / DC Orders ED Discharge Orders    None       Gailen Shelter, [place] 02/09/20 1434    Little, Ambrose Finland, MD 02/10/20 [phone]

## 2020-02-09 NOTE — Progress Notes (Signed)
Orthopedic Tech Progress Note Patient Details:  Kevin Dominguez Jan 01, 1981 [id number]  Ortho Devices Type of Ortho Device: Ulna gutter splint Ortho Device/Splint Interventions: Application   Post Interventions Patient Tolerated: Well   Gwendolyn Lima 02/09/2020, 2:21 PM

## 2020-02-09 NOTE — Discharge Instructions (Addendum)
Please call tomorrow morning to make an appointment with the orthopedic doctor.  You then found to have a boxer's fracture.  Please wear the provided to you until you are seen by orthopedic doctor.  Please use pain medication only as needed and use Tylenol and ibuprofen primarily for pain control.  Please use Tylenol or ibuprofen for pain.  You may use 600 mg ibuprofen every 6 hours or 1000 mg of Tylenol every 6 hours.  You may choose to alternate between the 2.  This would be most effective.  Not to exceed 4 g of Tylenol within 24 hours.  Not to exceed 3200 mg ibuprofen 24 hours.

## 2020-02-09 NOTE — ED Notes (Signed)
Ortho tech at bedside applying splint. 

## 2020-02-09 NOTE — ED Triage Notes (Signed)
C/o R hand pain.  States he hit it on a door last night.

## 2020-03-01 ENCOUNTER — Other Ambulatory Visit: Payer: Self-pay

## 2020-03-01 ENCOUNTER — Emergency Department (HOSPITAL_COMMUNITY)
Admission: EM | Admit: 2020-03-01 | Discharge: 2020-03-01 | Disposition: A | Discharge status: Home / Self Care | Payer: Self-pay | Attending: Emergency Medicine | Admitting: Emergency Medicine

## 2020-03-01 ENCOUNTER — Encounter (HOSPITAL_COMMUNITY): Payer: Self-pay | Admitting: Emergency Medicine

## 2020-03-01 DIAGNOSIS — Z5321 Procedure and treatment not carried out due to patient leaving prior to being seen by health care provider: Secondary | ICD-10-CM | POA: Insufficient documentation

## 2020-03-01 DIAGNOSIS — Z4802 Encounter for removal of sutures: Secondary | ICD-10-CM | POA: Insufficient documentation

## 2020-03-01 NOTE — ED Notes (Signed)
Pt came to this tech stating he was leaving. Pt was encouraged to stay. Pt left 

## 2020-03-01 NOTE — ED Triage Notes (Signed)
Pt requesting to have staples removed from his head. No s/s infection.

## 2020-03-02 ENCOUNTER — Emergency Department (HOSPITAL_COMMUNITY)
Admission: EM | Admit: 2020-03-02 | Discharge: 2020-03-02 | Disposition: A | Discharge status: Home / Self Care | Payer: Self-pay | Attending: Emergency Medicine | Admitting: Emergency Medicine

## 2020-03-02 DIAGNOSIS — Z79899 Other long term (current) drug therapy: Secondary | ICD-10-CM | POA: Insufficient documentation

## 2020-03-02 DIAGNOSIS — S0101XD Laceration without foreign body of scalp, subsequent encounter: Secondary | ICD-10-CM | POA: Insufficient documentation

## 2020-03-02 DIAGNOSIS — Z4802 Encounter for removal of sutures: Secondary | ICD-10-CM

## 2020-03-02 DIAGNOSIS — F17290 Nicotine dependence, other tobacco product, uncomplicated: Secondary | ICD-10-CM | POA: Insufficient documentation
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
## 2020-03-02 NOTE — Discharge Instructions (Signed)
As discussed, you had your staples removed today. Continue to check for signs of infection. Return to the ER for new or worsening symptoms.
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
## 2021-08-25 ENCOUNTER — Emergency Department (HOSPITAL_COMMUNITY)
Admission: EM | Admit: 2021-08-25 | Discharge: 2021-08-25 | Discharge status: Against Medical Advice | Payer: Self-pay | Source: Home / Self Care

## 2021-09-20 ENCOUNTER — Encounter (HOSPITAL_COMMUNITY): Payer: Self-pay | Admitting: Emergency Medicine

## 2021-09-20 ENCOUNTER — Emergency Department (HOSPITAL_COMMUNITY)
Admission: EM | Admit: 2021-09-20 | Discharge: 2021-09-20 | Disposition: A | Discharge status: Home / Self Care | Payer: Self-pay | Attending: Emergency Medicine | Admitting: Emergency Medicine

## 2021-09-20 DIAGNOSIS — R6889 Other general symptoms and signs: Secondary | ICD-10-CM

## 2021-09-20 DIAGNOSIS — E860 Dehydration: Secondary | ICD-10-CM | POA: Insufficient documentation

## 2021-09-20 DIAGNOSIS — R1084 Generalized abdominal pain: Secondary | ICD-10-CM | POA: Insufficient documentation

## 2021-09-20 DIAGNOSIS — R197 Diarrhea, unspecified: Secondary | ICD-10-CM | POA: Insufficient documentation

## 2021-09-20 DIAGNOSIS — F17290 Nicotine dependence, other tobacco product, uncomplicated: Secondary | ICD-10-CM | POA: Insufficient documentation

## 2021-09-20 DIAGNOSIS — R112 Nausea with vomiting, unspecified: Secondary | ICD-10-CM | POA: Insufficient documentation

## 2021-09-20 DIAGNOSIS — Z20822 Contact with and (suspected) exposure to covid-19: Secondary | ICD-10-CM | POA: Insufficient documentation

## 2021-09-20 LAB — URINALYSIS, ROUTINE W REFLEX MICROSCOPIC
Bilirubin Urine: NEGATIVE
Glucose, UA: NEGATIVE mg/dL
Ketones, ur: NEGATIVE mg/dL
Leukocytes,Ua: NEGATIVE
Nitrite: NEGATIVE
Protein, ur: 30 mg/dL — AB
Specific Gravity, Urine: 1.025 (ref 1.005–1.030)
pH: 6 (ref 5.0–8.0)

## 2021-09-20 LAB — CBC
HCT: 52.2 % — ABNORMAL HIGH (ref 39.0–52.0)
Hemoglobin: 18 g/dL — ABNORMAL HIGH (ref 13.0–17.0)
MCH: 29.9 pg (ref 26.0–34.0)
MCHC: 34.5 g/dL (ref 30.0–36.0)
MCV: 86.7 fL (ref 80.0–100.0)
Platelets: 147 10*3/uL — ABNORMAL LOW (ref 150–400)
RBC: 6.02 MIL/uL — ABNORMAL HIGH (ref 4.22–5.81)
RDW: 14.2 % (ref 11.5–15.5)
WBC: 3.1 10*3/uL — ABNORMAL LOW (ref 4.0–10.5)
nRBC: 0 % (ref 0.0–0.2)

## 2021-09-20 LAB — RESP PANEL BY RT-PCR (FLU A&B, COVID) ARPGX2
Influenza A by PCR: NEGATIVE
Influenza B by PCR: NEGATIVE
SARS Coronavirus 2 by RT PCR: NEGATIVE

## 2021-09-20 LAB — COMPREHENSIVE METABOLIC PANEL
ALT: 21 U/L (ref 0–44)
AST: 27 U/L (ref 15–41)
Albumin: 4.2 g/dL (ref 3.5–5.0)
Alkaline Phosphatase: 56 U/L (ref 38–126)
Anion gap: 11 (ref 5–15)
BUN: 11 mg/dL (ref 6–20)
CO2: 25 mmol/L (ref 22–32)
Calcium: 9.5 mg/dL (ref 8.9–10.3)
Chloride: 97 mmol/L — ABNORMAL LOW (ref 98–111)
Creatinine, Ser: 1.12 mg/dL (ref 0.61–1.24)
GFR, Estimated: >60 mL/min (ref >60)
Glucose, Bld: 96 mg/dL (ref 70–99)
Potassium: 4.2 mmol/L (ref 3.5–5.1)
Sodium: 133 mmol/L — ABNORMAL LOW (ref 135–145)
Total Bilirubin: 0.6 mg/dL (ref 0.3–1.2)
Total Protein: 8.4 g/dL — ABNORMAL HIGH (ref 6.5–8.1)

## 2021-09-20 LAB — URINALYSIS, MICROSCOPIC (REFLEX): Bacteria, UA: NONE SEEN

## 2021-09-20 LAB — LIPASE, BLOOD: Lipase: 28 U/L (ref 11–51)

## 2021-09-20 MED ORDER — SODIUM CHLORIDE 0.9 % IV BOLUS
1000.0000 mL | Freq: Once | INTRAVENOUS | Status: AC
  Administered 2021-09-20: 1000 mL via INTRAVENOUS

## 2021-09-20 NOTE — ED Triage Notes (Signed)
Pt. Stated, Ive had N/V/D since Saturday and some stomach pain with chills.

## 2021-09-20 NOTE — ED Provider Notes (Signed)
[facility] EMERGENCY DEPARTMENT Provider Note   CSN: [id number] Arrival date & time: 09/20/21  1224     History Chief Complaint  Patient presents with   Abdominal Pain   Nausea   Emesis   Diarrhea    Kevin Dominguez is a 40 y.o. male.  Pt presents to the ED today with chills and aches all over.  Pt took 2 home Covid tests which were negative.  Pt had some abd pain last night, but that is better now.  He has not had much of an appetite.  Pt feels dehydrated.        No past medical history on file.  There are no problems to display for this patient.   Past Surgical History:  Procedure Laterality Date   FINGER SURGERY     2nd and 3rd digits       No family history on file.  Social History   Tobacco Use   Smoking status: Every Day    Types: Cigars   Smokeless tobacco: Never  Vaping Use   Vaping Use: Never used  Substance Use Topics   Alcohol use: Yes   Drug use: Not Currently    Types: Marijuana    Home Medications Prior to Admission medications   Medication Sig Start Date End Date Taking? Authorizing Provider  acetaminophen (TYLENOL) 500 MG tablet Take 1,000 mg by mouth every 6 (six) hours as needed. For pain   Yes [provider]  ibuprofen (ADVIL) 200 MG tablet Take 400 mg by mouth every 6 (six) hours as needed for headache or moderate pain.   Yes [provider]  neomycin-bacitracin-polymyxin (NEOSPORIN) ointment Apply 1 application topically every 12 (twelve) hours.   Yes [provider]  Tetrahydroz-Glyc-Hyprom-PEG (VISINE MAXIMUM REDNESS RELIEF OP) Apply 1-2 drops to eye daily as needed (redness).   Yes [provider]    Allergies    Patient has no known allergies.  Review of Systems   Review of Systems  Constitutional:  Positive for chills.  Gastrointestinal:  Positive for nausea.  Musculoskeletal:  Positive for myalgias.  All other systems reviewed and are negative.  Physical  Exam Updated Vital Signs BP 129/90   Pulse (!) 56   Temp 99 F (37.2 C)   Resp 16   SpO2 100%   Physical Exam Vitals and nursing note reviewed.  Constitutional:      Appearance: He is well-developed.  HENT:     Head: Normocephalic and atraumatic.     Mouth/Throat:     Mouth: Mucous membranes are moist.  Eyes:     Extraocular Movements: Extraocular movements intact.     Pupils: Pupils are equal, round, and reactive to light.  Cardiovascular:     Rate and Rhythm: Normal rate and regular rhythm.  Pulmonary:     Effort: Pulmonary effort is normal.     Breath sounds: Normal breath sounds.  Abdominal:     General: Abdomen is flat. Bowel sounds are normal.     Palpations: Abdomen is soft.     Tenderness: There is generalized abdominal tenderness.  Skin:    General: Skin is warm.     Capillary Refill: Capillary refill takes less than 2 seconds.  Neurological:     General: No focal deficit present.     Mental Status: He is alert and oriented to person, place, and time.  Psychiatric:        Mood and Affect: Mood normal.  Behavior: Behavior normal.    ED Results / Procedures / Treatments   Labs (all labs ordered are listed, but only abnormal results are displayed) Labs Reviewed  COMPREHENSIVE METABOLIC PANEL - Abnormal; Notable for the following components:      Result Value   Sodium 133 (*)    Chloride 97 (*)    Total Protein 8.4 (*)    All other components within normal limits  CBC - Abnormal; Notable for the following components:   WBC 3.1 (*)    RBC 6.02 (*)    Hemoglobin 18.0 (*)    HCT 52.2 (*)    Platelets 147 (*)    All other components within normal limits  URINALYSIS, ROUTINE W REFLEX MICROSCOPIC - Abnormal; Notable for the following components:   Hgb urine dipstick SMALL (*)    Protein, ur 30 (*)    All other components within normal limits  RESP PANEL BY RT-PCR (FLU A&B, COVID) ARPGX2  LIPASE, BLOOD  URINALYSIS, MICROSCOPIC (REFLEX)     EKG None  Radiology No results found.  Procedures Procedures   Medications Ordered in ED Medications  sodium chloride 0.9 % bolus 1,000 mL (0 mLs Intravenous Stopped 09/20/21 1930)    ED Course  I have reviewed the triage vital signs and the nursing notes.  Pertinent labs & imaging results that were available during my care of the patient were reviewed by me and considered in my medical decision making (see chart for details).    MDM Rules/Calculators/A&P                           Covid/flu neg.  However, pt's sx sound flu-like.  I think he has a false neg flu test.  Pt is feeling better after fluids.  He is given a note for work.  He knows to return if worse.   Final Clinical Impression(s) / ED Diagnoses Final diagnoses:  Dehydration  Flu-like symptoms    Rx / DC Orders ED Discharge Orders     None        Jacalyn Lefevre, MD 09/20/21 2050

## 2024-11-05 ENCOUNTER — Other Ambulatory Visit: Payer: Self-pay

## 2024-11-05 ENCOUNTER — Emergency Department (HOSPITAL_COMMUNITY)
Admission: EM | Admit: 2024-11-05 | Discharge: 2024-11-05 | Disposition: A | Discharge status: Home / Self Care | Attending: Emergency Medicine | Admitting: Emergency Medicine

## 2024-11-05 DIAGNOSIS — U071 COVID-19: Secondary | ICD-10-CM | POA: Diagnosis not present

## 2024-11-05 DIAGNOSIS — R059 Cough, unspecified: Secondary | ICD-10-CM | POA: Diagnosis present

## 2024-11-05 LAB — RESP PANEL BY RT-PCR (RSV, FLU A&B, COVID)  RVPGX2
Influenza A by PCR: NEGATIVE
Influenza B by PCR: NEGATIVE
Resp Syncytial Virus by PCR: NEGATIVE
SARS Coronavirus 2 by RT PCR: POSITIVE — AB

## 2024-11-05 MED ORDER — ONDANSETRON 4 MG PO TBDP
4.0000 mg | ORAL_TABLET | Freq: Once | ORAL | Status: AC
  Administered 2024-11-05: 4 mg via ORAL
  Filled 2024-11-05: qty 1

## 2024-11-05 NOTE — Discharge Instructions (Signed)
 You were seen for your COVID infection in the emergency department.   At home, please use Tylenol  and ibuprofen  for your muscle aches and fevers.  Please use over-the-counter cough medication or tea with honey for your cough.  Follow-up with your primary doctor in 2-3 days regarding your visit.  This may be over the phone.  Return immediately to the emergency department if you experience any of the following: Difficulty breathing, or any other concerning symptoms.    Thank you for visiting our Emergency Department. It was a pleasure taking care of you today.

## 2024-11-05 NOTE — ED Triage Notes (Signed)
 Pt. Stated, I woke up yesterday and started feeling bad. Cough, nausea, bodyaches. No nausea today but just feel bad all over.

## 2024-11-05 NOTE — ED Notes (Signed)
 Called lab to request update on respiratory swab still showing collected. Microbiology tech confirms receipt of swab in lab and will process now.

## 2024-11-05 NOTE — ED Provider Triage Note (Signed)
 Emergency Medicine Provider Triage Evaluation Note  Ruven Corradi , a 44 y.o. male  was evaluated in triage.  Pt complains of generalized fatigue and URI symptoms with nausea and vomiting.  Family member sick with similar symptoms and tested positive for COVID.  Review of Systems  Positive:  Negative:   Physical Exam  BP (!) 134/100   Pulse 79   Temp 98.4 F (36.9 C)   Resp 17   Ht 6' 1 (1.854 m)   Wt 70.3 kg   SpO2 95%   BMI 20.45 kg/m  Gen:   Awake, no distress   Resp:  Normal effort  MSK:   Moves extremities without difficulty  Other:  No abdominal tenderness palpation  Medical Decision Making  Medically screening exam initiated at 10:30 AM.  Appropriate orders placed.  Haygen Renwick Asman was informed that the remainder of the evaluation will be completed by another provider, this initial triage assessment does not replace that evaluation, and the importance of remaining in the ED until their evaluation is complete.   Yolande Lamar BROCKS, MD 11/05/24 1030

## 2024-11-05 NOTE — ED Provider Notes (Signed)
 " [facility] EMERGENCY DEPARTMENT AT [facility] Provider Note   CSN: [id number] Arrival date & time: 11/05/24  1016     Patient presents with: Cough and nasal congedtion   Kevin Dominguez is a 44 y.o. male.  {Add pertinent medical, surgical, social history, OB history to HPI:5844} 44 year old male previously healthy presents emergency department with URI type symptoms.  Yesterday started feeling sick.  Subjective fevers and chills.  Diffuse myalgias.  Several episodes of nonbloody nonbilious emesis and a mild headache.  No abdominal pain.  Family member sick with similar symptoms and tested positive for COVID       Prior to Admission medications  Medication Sig Start Date End Date Taking? Authorizing Provider  acetaminophen  (TYLENOL ) 500 MG tablet Take 1,000 mg by mouth every 6 (six) hours as needed. For pain    [provider]  ibuprofen  (ADVIL ) 200 MG tablet Take 400 mg by mouth every 6 (six) hours as needed for headache or moderate pain.    [provider]  neomycin-bacitracin-polymyxin (NEOSPORIN) ointment Apply 1 application topically every 12 (twelve) hours.    [provider]  Tetrahydroz-Glyc-Hyprom-PEG (VISINE MAXIMUM REDNESS RELIEF OP) Apply 1-2 drops to eye daily as needed (redness).    [provider]    Allergies: Patient has no known allergies.    Review of Systems  Updated Vital Signs BP (!) 134/100   Pulse 79   Temp 98.4 F (36.9 C)   Resp 17   Ht 6' 1 (1.854 m)   Wt 70.3 kg   SpO2 95%   BMI 20.45 kg/m   Physical Exam Vitals and nursing note reviewed.  Constitutional:      General: He is not in acute distress.    Appearance: He is well-developed.  HENT:     Head: Normocephalic and atraumatic.     Right Ear: External ear normal.     Left Ear: External ear normal.     Nose: Nose normal.     Mouth/Throat:     Mouth: Mucous membranes are moist.     Pharynx: Oropharynx is clear.  Eyes:      Extraocular Movements: Extraocular movements intact.     Conjunctiva/sclera: Conjunctivae normal.     Pupils: Pupils are equal, round, and reactive to light.  Cardiovascular:     Rate and Rhythm: Normal rate and regular rhythm.     Heart sounds: Normal heart sounds.  Pulmonary:     Effort: Pulmonary effort is normal. No respiratory distress.     Breath sounds: Normal breath sounds.  Abdominal:     General: There is no distension.     Palpations: Abdomen is soft. There is no mass.     Tenderness: There is no abdominal tenderness. There is no guarding.  Musculoskeletal:     Cervical back: Normal range of motion and neck supple.  Skin:    General: Skin is warm and dry.  Neurological:     Mental Status: He is alert. Mental status is at baseline.  Psychiatric:        Mood and Affect: Mood normal.        Behavior: Behavior normal.     (all labs ordered are listed, but only abnormal results are displayed) Labs Reviewed  RESP PANEL BY RT-PCR (RSV, FLU A&B, COVID)  RVPGX2    EKG: None  Radiology: No results found.  {Document cardiac monitor, telemetry assessment procedure when appropriate:32947} Procedures   Medications Ordered in the ED  ondansetron  (ZOFRAN -ODT) disintegrating tablet 4 mg (has no administration in time range)      {Click here for ABCD2, HEART and other calculators REFRESH Note before signing:1}                              Medical Decision Making Risk Prescription drug management.   ***  {Document critical care time when appropriate  Document review of labs and clinical decision tools ie CHADS2VASC2, etc  Document your independent review of radiology images and any outside records  Document your discussion with family members, caretakers and with consultants  Document social determinants of health affecting pt's care  Document your decision making why or why not admission, treatments were needed:32947:::1}   Final diagnoses:  None    ED  Discharge Orders     None        "
